# Patient Record
Sex: Female | Born: 1970 | Hispanic: Yes | Marital: Single | Smoking: Never smoker
Health system: Southern US, Community
[De-identification: ages and names within clinical notes are randomized; demographics above are authoritative.]

## PROBLEM LIST (undated history)

## (undated) DIAGNOSIS — J45909 Unspecified asthma, uncomplicated: Secondary | ICD-10-CM

---

## 2016-09-11 ENCOUNTER — Emergency Department (HOSPITAL_COMMUNITY): Payer: PRIVATE HEALTH INSURANCE

## 2016-09-11 ENCOUNTER — Emergency Department (HOSPITAL_COMMUNITY)
Admission: EM | Admit: 2016-09-11 | Discharge: 2016-09-12 | Disposition: A | Discharge status: Home / Self Care | Payer: PRIVATE HEALTH INSURANCE | Attending: Emergency Medicine | Admitting: Emergency Medicine

## 2016-09-11 ENCOUNTER — Encounter (HOSPITAL_COMMUNITY): Payer: Self-pay

## 2016-09-11 DIAGNOSIS — N939 Abnormal uterine and vaginal bleeding, unspecified: Secondary | ICD-10-CM

## 2016-09-11 LAB — URINALYSIS, ROUTINE W REFLEX MICROSCOPIC
Bilirubin Urine: NEGATIVE
Glucose, UA: NEGATIVE mg/dL
Ketones, ur: NEGATIVE mg/dL
NITRITE: NEGATIVE
PROTEIN: 30 mg/dL — AB
Specific Gravity, Urine: 1.01 (ref 1.005–1.030)
pH: 8 (ref 5.0–8.0)

## 2016-09-11 LAB — WET PREP, GENITAL
CLUE CELLS WET PREP: NONE SEEN
Sperm: NONE SEEN
TRICH WET PREP: NONE SEEN
YEAST WET PREP: NONE SEEN

## 2016-09-11 LAB — CBC
HCT: 32.7 % — ABNORMAL LOW (ref 36.0–46.0)
Hemoglobin: 10.8 g/dL — ABNORMAL LOW (ref 12.0–15.0)
MCH: 28.8 pg (ref 26.0–34.0)
MCHC: 33 g/dL (ref 30.0–36.0)
MCV: 87.2 fL (ref 78.0–100.0)
Platelets: 320 10*3/uL (ref 150–400)
RBC: 3.75 MIL/uL — AB (ref 3.87–5.11)
RDW: 13.6 % (ref 11.5–15.5)
WBC: 5.9 10*3/uL (ref 4.0–10.5)

## 2016-09-11 LAB — URINALYSIS, MICROSCOPIC (REFLEX)

## 2016-09-11 MED ORDER — NAPROXEN 375 MG PO TABS: 375.0000 mg | ORAL_TABLET | Freq: Two times a day (BID) | ORAL | 0 refills | Status: DC

## 2016-09-11 MED ORDER — MEGESTROL ACETATE 40 MG PO TABS: ORAL_TABLET | ORAL | 0 refills | Status: DC

## 2016-09-11 MED ORDER — SODIUM CHLORIDE 0.9 % IV BOLUS (SEPSIS)
1000.0000 mL | Freq: Once | INTRAVENOUS | Status: AC
  Administered 2016-09-11: 1000 mL via INTRAVENOUS

## 2016-09-11 NOTE — ED Notes (Signed)
Patient transported to Ultrasound 

## 2016-09-11 NOTE — Discharge Instructions (Signed)
Get help right away if:  You pass out.  You have to change pads every 15 to 30 minutes.  You have belly pain.  You have a fever.  You become sweaty or weak.  You are passing large blood clots from the vagina.  You feel sick to your stomach (nauseous) and throw up (vomit).

## 2016-09-11 NOTE — ED Provider Notes (Signed)
  [facility]-EMERGENCY DEPT Provider Note   CSN: [id number] Arrival date & time: 09/11/16  1447     History   Chief Complaint Chief Complaint  Patient presents with  . Vaginal Bleeding    HPI Cathy Rice is a 46 y.o. female who presents to the ED with cc of vaginal bleeding. She states that she has had cramping pelvic pain and heavy vaginal bleeding for 1 month. Sh  HPI  History reviewed. No pertinent past medical history.  There are no active problems to display for this patient.   History reviewed. No pertinent surgical history.  OB History    No data available       Home Medications    Prior to Admission medications   Not on File    Family History History reviewed. No pertinent family history.  Social History Social History  Substance Use Topics  . Smoking status: Never Smoker  . Smokeless tobacco: Never Used  . Alcohol use No     Allergies   Patient has no known allergies.   Review of Systems Review of Systems   Physical Exam Updated Vital Signs BP 109/58 (BP Location: Right Arm)   Pulse 67   Temp 97.8 F (36.6 C) (Oral)   Resp 20   SpO2 100%   Physical Exam   ED Treatments / Results  Labs (all labs ordered are listed, but only abnormal results are displayed) Labs Reviewed  CBC - Abnormal; Notable for the following:       Result Value   RBC 3.75 (*)    Hemoglobin 10.8 (*)    HCT 32.7 (*)    All other components within normal limits  WET PREP, GENITAL  RPR  HIV ANTIBODY (ROUTINE TESTING)  URINALYSIS, ROUTINE W REFLEX MICROSCOPIC  GC/CHLAMYDIA PROBE AMP ([place]) NOT AT [facility]    EKG  EKG Interpretation None       Radiology No results found.  Procedures Procedures (including critical care time)  Medications Ordered in ED Medications - No data to display   Initial Impression / Assessment and Plan / ED Course  I have reviewed the triage vital signs and the nursing notes.  Pertinent labs & imaging  results that were available during my care of the patient were reviewed by me and considered in my medical decision making (see chart for details).  Clinical Course      patient with persistent vaginal bleeding.lightheaded with standing, but improved with fluids. HGb 10.8. Orthostatics negative. I have spoken with Dr. Tinnie Gensanya Pratt of faculty practice who recommends megace 40mg  bid until bleeding resolves and then 40 mg daily. She is to follow up with [facility]. Patient appears safe for dischage. Discussed return precautions.  Final Clinical Impressions(s) / ED Diagnoses   Final diagnoses:  Vaginal bleeding    New Prescriptions New Prescriptions   No medications on file     Arthor Captainbigail Landis Cassaro, PA-C 09/11/16 2357    Margarita Grizzleanielle Ray, MD 09/12/16 2342

## 2016-09-11 NOTE — ED Notes (Signed)
Pt informed of need of urine sample 

## 2016-09-11 NOTE — ED Notes (Signed)
EDP at bedside  

## 2016-09-11 NOTE — ED Triage Notes (Signed)
Pt here with family reporting she has had menstrual period X1 month. Pt family also reports pt has been having low blood pressure. Pt alert and oriented, BP 113/43 in triage. Pt reports heavy bleeding but denies any blood clots at this time.

## 2016-09-11 NOTE — ED Notes (Signed)
Pt returned from Ultrasound.

## 2016-09-12 LAB — RPR: RPR Ser Ql: NONREACTIVE

## 2016-09-12 LAB — HIV ANTIBODY (ROUTINE TESTING W REFLEX): HIV Screen 4th Generation wRfx: NONREACTIVE

## 2016-10-27 ENCOUNTER — Encounter: Payer: PRIVATE HEALTH INSURANCE | Admitting: Obstetrics & Gynecology

## 2018-11-12 ENCOUNTER — Encounter (HOSPITAL_COMMUNITY): Payer: Self-pay | Admitting: Emergency Medicine

## 2018-11-12 ENCOUNTER — Observation Stay (HOSPITAL_COMMUNITY)
Admission: EM | Admit: 2018-11-12 | Discharge: 2018-11-12 | Disposition: A | Discharge status: Home / Self Care | Payer: Self-pay | Attending: Internal Medicine | Admitting: Internal Medicine

## 2018-11-12 ENCOUNTER — Other Ambulatory Visit: Payer: Self-pay

## 2018-11-12 ENCOUNTER — Emergency Department (HOSPITAL_COMMUNITY): Payer: Self-pay

## 2018-11-12 ENCOUNTER — Observation Stay (HOSPITAL_BASED_OUTPATIENT_CLINIC_OR_DEPARTMENT_OTHER): Payer: PRIVATE HEALTH INSURANCE

## 2018-11-12 DIAGNOSIS — Z8249 Family history of ischemic heart disease and other diseases of the circulatory system: Secondary | ICD-10-CM | POA: Insufficient documentation

## 2018-11-12 DIAGNOSIS — J45909 Unspecified asthma, uncomplicated: Secondary | ICD-10-CM | POA: Insufficient documentation

## 2018-11-12 DIAGNOSIS — I9589 Other hypotension: Secondary | ICD-10-CM

## 2018-11-12 DIAGNOSIS — E876 Hypokalemia: Secondary | ICD-10-CM | POA: Insufficient documentation

## 2018-11-12 DIAGNOSIS — E861 Hypovolemia: Secondary | ICD-10-CM

## 2018-11-12 DIAGNOSIS — R079 Chest pain, unspecified: Secondary | ICD-10-CM | POA: Diagnosis present

## 2018-11-12 DIAGNOSIS — R002 Palpitations: Secondary | ICD-10-CM | POA: Insufficient documentation

## 2018-11-12 DIAGNOSIS — R0789 Other chest pain: Principal | ICD-10-CM | POA: Insufficient documentation

## 2018-11-12 DIAGNOSIS — I959 Hypotension, unspecified: Secondary | ICD-10-CM | POA: Insufficient documentation

## 2018-11-12 DIAGNOSIS — I361 Nonrheumatic tricuspid (valve) insufficiency: Secondary | ICD-10-CM

## 2018-11-12 DIAGNOSIS — Z79899 Other long term (current) drug therapy: Secondary | ICD-10-CM | POA: Insufficient documentation

## 2018-11-12 HISTORY — DX: Unspecified asthma, uncomplicated: J45.909

## 2018-11-12 LAB — COMPREHENSIVE METABOLIC PANEL
ALT: 28 U/L (ref 0–44)
AST: 40 U/L (ref 15–41)
Albumin: 3.8 g/dL (ref 3.5–5.0)
Alkaline Phosphatase: 65 U/L (ref 38–126)
Anion gap: 12 (ref 5–15)
BUN: 8 mg/dL (ref 6–20)
CHLORIDE: 105 mmol/L (ref 98–111)
CO2: 20 mmol/L — ABNORMAL LOW (ref 22–32)
CREATININE: 0.79 mg/dL (ref 0.44–1.00)
Calcium: 9.4 mg/dL (ref 8.9–10.3)
GFR calc Af Amer: >60 mL/min (ref >60)
GFR calc non Af Amer: >60 mL/min (ref >60)
Glucose, Bld: 123 mg/dL — ABNORMAL HIGH (ref 70–99)
Potassium: 2.9 mmol/L — ABNORMAL LOW (ref 3.5–5.1)
Sodium: 137 mmol/L (ref 135–145)
Total Bilirubin: 0.4 mg/dL (ref 0.3–1.2)
Total Protein: 7.2 g/dL (ref 6.5–8.1)

## 2018-11-12 LAB — CBC WITH DIFFERENTIAL/PLATELET
Abs Immature Granulocytes: 0.02 10*3/uL (ref 0.00–0.07)
Basophils Absolute: 0 10*3/uL (ref 0.0–0.1)
Basophils Relative: 1 %
Eosinophils Absolute: 0.1 10*3/uL (ref 0.0–0.5)
Eosinophils Relative: 2 %
HCT: 37.9 % (ref 36.0–46.0)
Hemoglobin: 12.2 g/dL (ref 12.0–15.0)
Immature Granulocytes: 0 %
Lymphocytes Relative: 40 %
Lymphs Abs: 3.2 10*3/uL (ref 0.7–4.0)
MCH: 28 pg (ref 26.0–34.0)
MCHC: 32.2 g/dL (ref 30.0–36.0)
MCV: 86.9 fL (ref 80.0–100.0)
Monocytes Absolute: 0.6 10*3/uL (ref 0.1–1.0)
Monocytes Relative: 7 %
Neutro Abs: 4.1 10*3/uL (ref 1.7–7.7)
Neutrophils Relative %: 50 %
PLATELETS: 340 10*3/uL (ref 150–400)
RBC: 4.36 MIL/uL (ref 3.87–5.11)
RDW: 13.1 % (ref 11.5–15.5)
WBC: 8.1 10*3/uL (ref 4.0–10.5)
nRBC: 0 % (ref 0.0–0.2)

## 2018-11-12 LAB — MRSA PCR SCREENING: MRSA by PCR: NEGATIVE

## 2018-11-12 LAB — HEMOGLOBIN A1C
Hgb A1c MFr Bld: 5.4 % (ref 4.8–5.6)
Mean Plasma Glucose: 108.28 mg/dL

## 2018-11-12 LAB — LIPASE, BLOOD: LIPASE: 34 U/L (ref 11–51)

## 2018-11-12 LAB — LIPID PANEL
Cholesterol: 167 mg/dL (ref 0–200)
HDL: 49 mg/dL (ref >40)
LDL Cholesterol: 113 mg/dL — ABNORMAL HIGH (ref 0–99)
Total CHOL/HDL Ratio: 3.4 RATIO
Triglycerides: 25 mg/dL (ref <150)
VLDL: 5 mg/dL (ref 0–40)

## 2018-11-12 LAB — TROPONIN I
Troponin I: <0.03 ng/mL (ref <0.03)
Troponin I: <0.03 ng/mL (ref <0.03)
Troponin I: <0.03 ng/mL (ref <0.03)

## 2018-11-12 LAB — I-STAT TROPONIN, ED: TROPONIN I, POC: 0 ng/mL (ref 0.00–0.08)

## 2018-11-12 LAB — I-STAT BETA HCG BLOOD, ED (MC, WL, AP ONLY)

## 2018-11-12 LAB — BASIC METABOLIC PANEL
ANION GAP: 10 (ref 5–15)
BUN: 6 mg/dL (ref 6–20)
CHLORIDE: 111 mmol/L (ref 98–111)
CO2: 18 mmol/L — ABNORMAL LOW (ref 22–32)
Calcium: 8.1 mg/dL — ABNORMAL LOW (ref 8.9–10.3)
Creatinine, Ser: 0.66 mg/dL (ref 0.44–1.00)
GFR calc Af Amer: >60 mL/min (ref >60)
GFR calc non Af Amer: >60 mL/min (ref >60)
Glucose, Bld: 96 mg/dL (ref 70–99)
Potassium: 4.4 mmol/L (ref 3.5–5.1)
Sodium: 139 mmol/L (ref 135–145)

## 2018-11-12 LAB — ECHOCARDIOGRAM COMPLETE
Height: 64 in
Weight: 2472.68 oz

## 2018-11-12 LAB — HIV ANTIBODY (ROUTINE TESTING W REFLEX): HIV Screen 4th Generation wRfx: NONREACTIVE

## 2018-11-12 MED ORDER — SODIUM CHLORIDE 0.9 % IV BOLUS
1000.0000 mL | Freq: Once | INTRAVENOUS | Status: AC
  Administered 2018-11-12: 1000 mL via INTRAVENOUS

## 2018-11-12 MED ORDER — ASPIRIN EC 81 MG PO TBEC: 81.0000 mg | DELAYED_RELEASE_TABLET | Freq: Every day | ORAL | Status: DC

## 2018-11-12 MED ORDER — POTASSIUM CHLORIDE CRYS ER 20 MEQ PO TBCR
40.0000 meq | EXTENDED_RELEASE_TABLET | Freq: Once | ORAL | Status: AC
  Administered 2018-11-12: 40 meq via ORAL
  Filled 2018-11-12: qty 2

## 2018-11-12 MED ORDER — ENOXAPARIN SODIUM 40 MG/0.4ML ~~LOC~~ SOLN
40.0000 mg | Freq: Every day | SUBCUTANEOUS | Status: DC
  Administered 2018-11-12: 40 mg via SUBCUTANEOUS
  Filled 2018-11-12 (×2): qty 0.4

## 2018-11-12 MED ORDER — ASPIRIN 81 MG PO CHEW: 324.0000 mg | CHEWABLE_TABLET | Freq: Once | ORAL | Status: DC

## 2018-11-12 MED ORDER — ACETAMINOPHEN 325 MG PO TABS: 650.0000 mg | ORAL_TABLET | ORAL | Status: DC | PRN

## 2018-11-12 MED ORDER — NITROGLYCERIN 0.4 MG SL SUBL
0.4000 mg | SUBLINGUAL_TABLET | SUBLINGUAL | Status: DC | PRN
  Filled 2018-11-12: qty 1

## 2018-11-12 MED ORDER — FENTANYL CITRATE (PF) 100 MCG/2ML IJ SOLN
50.0000 ug | Freq: Once | INTRAMUSCULAR | Status: AC
  Administered 2018-11-12: 50 ug via INTRAVENOUS

## 2018-11-12 MED ORDER — NITROGLYCERIN 0.4 MG SL SUBL: 0.4000 mg | SUBLINGUAL_TABLET | SUBLINGUAL | Status: DC | PRN

## 2018-11-12 MED ORDER — SODIUM CHLORIDE 0.9 % IV SOLN
INTRAVENOUS | Status: DC
  Administered 2018-11-12: 08:00:00 via INTRAVENOUS

## 2018-11-12 MED ORDER — MAGNESIUM SULFATE 2 GM/50ML IV SOLN
2.0000 g | Freq: Once | INTRAVENOUS | Status: AC
  Administered 2018-11-12: 2 g via INTRAVENOUS
  Filled 2018-11-12: qty 50

## 2018-11-12 MED ORDER — MORPHINE SULFATE (PF) 2 MG/ML IV SOLN: 1.0000 mg | INTRAVENOUS | Status: DC | PRN

## 2018-11-12 MED ORDER — SODIUM CHLORIDE 0.9 % IV SOLN
INTRAVENOUS | Status: DC
  Administered 2018-11-12: 10:00:00 via INTRAVENOUS

## 2018-11-12 MED ORDER — POTASSIUM CHLORIDE 10 MEQ/100ML IV SOLN
10.0000 meq | Freq: Once | INTRAVENOUS | Status: AC
  Administered 2018-11-12: 10 meq via INTRAVENOUS
  Filled 2018-11-12: qty 100

## 2018-11-12 MED ORDER — ONDANSETRON HCL 4 MG/2ML IJ SOLN: 4.0000 mg | Freq: Four times a day (QID) | INTRAMUSCULAR | Status: DC | PRN

## 2018-11-12 MED ORDER — SODIUM CHLORIDE 0.9% FLUSH
3.0000 mL | Freq: Once | INTRAVENOUS | Status: AC
  Administered 2018-11-12: 3 mL via INTRAVENOUS

## 2018-11-12 NOTE — Progress Notes (Signed)
  Echocardiogram 2D Echocardiogram has been performed.  Leta Jungling M 11/12/2018, 10:23 AM

## 2018-11-12 NOTE — Consult Note (Signed)
Cardiology Consultation:   Patient ID: Cathy Rice MRN: [id number]; DOB: Dec 18, 1970  Admit date: 11/12/2018 Date of Consult: 11/12/2018  Primary Care Provider: Patient, No Pcp Per Primary Cardiologist: None  Patient Profile:   Cathy Rice de Ezequiel Ganser is a 48 y.o. female with a hx of asthma who is being seen today for the evaluation of chest pain at the request of Dr. Clayborne Dana.  History of Present Illness:   Cathy Rice is a 48 y.o. female with a hx of asthma who is being seen today for the evaluation of chest pain.  The patient primarily speaks Portugese, and history is gathered with the assistance of her sister-in-law at bedside. She has no cardiac history or history of chest pain or other cardiac symptoms. She was in her normal state of health until this evening, when she developed chest discomfort when laying in bed. She described the pain to be both a sharp and pressure sensation. She had some dyspnea without any other associated symptoms.   She presented to the ED where SBP were 80-100 with HR 70s. ECG showed NSR with mild depressions in lateral precordial leads. Troponin negative x1. Labs were otherwise notable for K 2.9. She was admitted to the hospitalist service, and cardiology was consulted for further recommendations.   Past Medical History:  Diagnosis Date  . Asthma     History reviewed. No pertinent surgical history.   Home Medications:  Prior to Admission medications   Medication Sig Start Date End Date Taking? Authorizing Provider  Ascorbic Acid (VITA-C PO) Take 1 tablet by mouth daily.   Yes [provider]  Cyanocobalamin (VITAMIN B-12 PO) Take 1 tablet by mouth daily.   Yes [provider]  Methylsulfonylmethane (MSM PO) Take 1 tablet by mouth daily.   Yes [provider]    Inpatient Medications: Scheduled Meds: . aspirin  324 mg Oral Once  . [START ON 11/13/2018] aspirin EC  81 mg Oral Daily  . enoxaparin  (LOVENOX) injection  40 mg Subcutaneous Daily  . fentaNYL (SUBLIMAZE) injection  50 mcg Intravenous Once  . sodium chloride flush  3 mL Intravenous Once   Continuous Infusions: . sodium chloride     PRN Meds: acetaminophen, morphine injection, nitroGLYCERIN, ondansetron (ZOFRAN) IV  Allergies:    Allergies  Allergen Reactions  . Lactose Intolerance (Gi) Diarrhea  . Wheat Bran Diarrhea    Social History:   Social History   Socioeconomic History  . Marital status: Single    Spouse name: Not on file  . Number of children: Not on file  . Years of education: Not on file  . Highest education level: Not on file  Occupational History  . Not on file  Social Needs  . Financial resource strain: Not on file  . Food insecurity:    Worry: Not on file    Inability: Not on file  . Transportation needs:    Medical: Not on file    Non-medical: Not on file  Tobacco Use  . Smoking status: Never Smoker  . Smokeless tobacco: Never Used  Substance and Sexual Activity  . Alcohol use: No  . Drug use: Never  . Sexual activity: Not on file  Lifestyle  . Physical activity:    Days per week: Not on file    Minutes per session: Not on file  . Stress: Not on file  Relationships  . Social connections:    Talks on phone: Not on file  Gets together: Not on file    Attends religious service: Not on file    Active member of club or organization: Not on file    Attends meetings of clubs or organizations: Not on file    Relationship status: Not on file  . Intimate partner violence:    Fear of current or ex partner: Not on file    Emotionally abused: Not on file    Physically abused: Not on file    Forced sexual activity: Not on file  Other Topics Concern  . Not on file  Social History Narrative  . Not on file    Family History:     Family History  Problem Relation Age of Onset  . Heart attack Father   . Heart disease Sister      ROS:  Please see the history of present illness.     All other ROS reviewed and negative.     Physical Exam/Data:   Vitals:   11/12/18 0115 11/12/18 0118 11/12/18 0300 11/12/18 0400  BP: (!) 103/59 (!) 103/59 (!) 89/29 (!) 91/46  Pulse: 93 82 71 74  Resp: 20 (!) 22 18 12   Temp:  98 F (36.7 C)    TempSrc:  Oral    SpO2: 100% 100% 98% 100%  Weight:  68 kg    Height:  5\' 4"  (1.626 m)     No intake or output data in the 24 hours ending 11/12/18 0444 Last 3 Weights 11/12/2018  Weight (lbs) 149 lb 14.6 oz  Weight (kg) 68 kg     Body mass index is 25.73 kg/m.  General:  Well nourished, well developed, in no acute distress  HEENT: normal Lymph: no adenopathy  Cardiac:  normal S1, S2; RRR; no murmur   Lungs:  clear to auscultation bilaterally, no wheezing, rhonchi or rales  Abd: soft, nontender, no hepatomegaly  Ext: no edema Musculoskeletal:  No deformities, BUE and BLE strength normal and equal Skin: warm and dry  Neuro:  No focal abnormalities noted Psych:  Normal affect   EKG:  The EKG was personally reviewed and demonstrates:  NSR with ST depressions in lateral precordial leads Telemetry:  Telemetry was personally reviewed and demonstrates:  No events  Relevant CV Studies: None  Laboratory Data:  Chemistry Recent Labs  Lab 11/12/18 0115  NA 137  K 2.9*  CL 105  CO2 20*  GLUCOSE 123*  BUN 8  CREATININE 0.79  CALCIUM 9.4  GFRNONAA >60  GFRAA >60  ANIONGAP 12    Recent Labs  Lab 11/12/18 0115  PROT 7.2  ALBUMIN 3.8  AST 40  ALT 28  ALKPHOS 65  BILITOT 0.4   Hematology Recent Labs  Lab 11/12/18 0115  WBC 8.1  RBC 4.36  HGB 12.2  HCT 37.9  MCV 86.9  MCH 28.0  MCHC 32.2  RDW 13.1  PLT 340   Cardiac Enzymes Recent Labs  Lab 11/12/18 0115  TROPONINI <0.03    Recent Labs  Lab 11/12/18 0148  TROPIPOC 0.00    BNPNo results for input(s): BNP, PROBNP in the last 168 hours.  DDimer No results for input(s): DDIMER in the last 168 hours.  Radiology/Studies:  Dg Chest 2 View  Result  Date: 11/12/2018 CLINICAL DATA:  Chest pain EXAM: CHEST - 2 VIEW COMPARISON:  None. FINDINGS: The heart size and mediastinal contours are within normal limits. Both lungs are clear. The visualized skeletal structures are unremarkable. IMPRESSION: No active cardiopulmonary disease. Electronically Signed  By: Jasmine Pang M.D.   On: 11/12/2018 01:51    Assessment and Plan:   Chest pain The patient has no significant medical history although she has a family history of heart disease. She presents with new onset, intermittent chest pain. Her pain has typical and atypical features for angina. ECG has nonspecific changes and troponin is negative. Her presentation is not typical for ACS.  -Continue to trend troponin -Echocardiogram ordered -Lipid panel, HgA1c ordered -May benefit from stress testing.   Hypokalemia Unclear why K is so low.  -Continue to replete.     For questions or updates, please contact [facility] Please consult [email or web address] for contact info under     Signed, Ernest Mallick, MD  11/12/2018 4:44 AM

## 2018-11-12 NOTE — ED Notes (Signed)
Pt appears very sleepy, wakes up appropriately, NAD, pt c/o pain when HOB raised.

## 2018-11-12 NOTE — ED Notes (Signed)
Hospitalist and cardiology at bedside

## 2018-11-12 NOTE — Progress Notes (Addendum)
Patient is alert and oriented  X 4. Out of bed ambulating with no complaints of shortness of breath, dizziness, and/or headache.

## 2018-11-12 NOTE — Discharge Summary (Signed)
Physician Discharge Summary  Cathy Rice WNU:[id number] DOB: 1971-06-02 DOA: 11/12/2018  PCP: Patient, No Pcp Per  Admit date: 11/12/2018 Discharge date: 11/12/2018  Admitted From: Home Disposition: Home  Recommendations for Outpatient Follow-up:  1. Follow up with PCP  Home Health: Home Equipment/Devices: None  Discharge Condition: Stable CODE STATUS: Full code Diet recommendation: Regular diet  Brief/Interim Summary: 48 year old female with history of asthma, primarily [place] speaking, admitted with chest pain during the night.  In the ER EKG with ST depression, chest x-ray unremarkable, blood pressure soft in the 80s to 100, hypokalemia negative first troponin and was admitted for chest pain evaluation Seen by cardiology and plan to obtain echocardiogram, rule out with serial troponin.  Noted possible stress test plan. This morning patient denies any chest pain, resting comfortably.  Blood pressure soft, asymptomatic.  Patient and family her blood pressure runs low and is normal for her, she is asymptomatic, able to ambulate in the room without issues. Seen by cardiologist, serial troponins has been negative ACS rule out.  Neurology does not advise any further inpatient work-up and okay for discharge home.  Echocardiogram is done was grossly normal per cardiology report.  Final report came back later as below.  Discharge Diagnoses:  Principal Problem:   Chest pain at rest Active Problems:   Hypokalemia   Hypotension  Atypical chest pain,  Reproducible w palpation. ACS ruled out.  Follow-up with PCP outpatient.  No further cardiac work-up as per cardiology. Hypokalemia-resolved Hypotension, chronic and asymptomatic.  Discharge Instructions  Discharge Instructions    Diet - low sodium heart healthy   Complete by:  As directed    Discharge instructions   Complete by:  As directed    Please call call MD or return to ER for similar recurring problem,  nausea/vomiting, uncontrolled pain, chest pain.  Please follow-up your doctor as instructed in a week time and call the office for appointment. Please avoid alcohol, smoking, or any other illicit substance.   Increase activity slowly   Complete by:  As directed      Allergies as of 11/12/2018      Reactions   Lactose Intolerance (gi) Diarrhea   Wheat Bran Diarrhea      Medication List    TAKE these medications   MSM PO Take 1 tablet by mouth daily.   VITA-C PO Take 1 tablet by mouth daily.   VITAMIN B-12 PO Take 1 tablet by mouth daily.      Follow-up Information    primary care physician Follow up in 1 week(s).          Allergies  Allergen Reactions  . Lactose Intolerance (Gi) Diarrhea  . Wheat Bran Diarrhea    Consultations:  Cardiology   Procedures/Studies: Dg Chest 2 View  Result Date: 11/12/2018 CLINICAL DATA:  Chest pain EXAM: CHEST - 2 VIEW COMPARISON:  None. FINDINGS: The heart size and mediastinal contours are within normal limits. Both lungs are clear. The visualized skeletal structures are unremarkable. IMPRESSION: No active cardiopulmonary disease. Electronically Signed   By: Jasmine Pang M.D.   On: 11/12/2018 01:51   Echo: result  1. The left ventricle has normal systolic function with an ejection fraction of 60-65%. The cavity size was normal. Left ventricular diastolic parameters were normal. No evidence of left ventricular regional wall motion abnormalities.  2. Mild septal flattening in diastole suggests possible RV volume overload.  3. The right ventricle has normal systolic function. The cavity was RV is  mild-moderately enlarged. There is no increase in right ventricular wall thickness.  4. No hemodynamically significant valve disease.  5. The inferior vena cava was dilated in size with >50% respiratory variability. Subjective: Resting comfortably.  No more chest pain.  Family at the bedside.  Discharge Exam: Vitals:   11/12/18 0802  11/12/18 1120  BP: (!) 91/49 (!) 94/50  Pulse: 72 69  Resp: 13 11  Temp: 98 F (36.7 C) (!) 97.4 F (36.3 C)  SpO2: 99% 96%   Vitals:   11/12/18 0400 11/12/18 0500 11/12/18 0802 11/12/18 1120  BP: (!) 91/46 (!) 97/51 (!) 91/49 (!) 94/50  Pulse: 74 75 72 69  Resp: Temp:  98.3 F (36.8 C) 98 F (36.7 C) (!) 97.4 F (36.3 C)  TempSrc:  Oral Oral Oral  SpO2: 100% 100% 99% 96%  Weight:  70.1 kg    Height:   (1.626 m)      General: Pt is alert, awake, not in acute distress Cardiovascular: RRR, S1/S2 +, no rubs, no gallops Respiratory: CTA bilaterally, no wheezing, no rhonchi Abdominal: Soft, NT, ND, bowel sounds + Extremities: no edema, no cyanosis   The results of significant diagnostics from this hospitalization (including imaging, microbiology, ancillary and laboratory) are listed below for reference.     Microbiology: Recent Results (from the past 240 hour(s))  MRSA PCR Screening     Status: None   Collection Time: 11/12/18  5:37 AM  Result Value Ref Range Status   MRSA by PCR NEGATIVE NEGATIVE Final    Comment:        The GeneXpert MRSA Assay (FDA approved for NASAL specimens only), is one component of a comprehensive MRSA colonization surveillance program. It is not intended to diagnose MRSA infection nor to guide or monitor treatment for MRSA infections. Performed at [facility], [address], [place], [place] [address]      Labs: BNP (last 3 results) No results for input(s): BNP in the last 8760 hours. Basic Metabolic Panel: Recent Labs  Lab 11/12/18 0115 11/12/18 0655  NA 137 139  K 2.9* 4.4  CL 105 111  CO2 20* 18*  GLUCOSE 123* 96  BUN 8 6  CREATININE 0.79 0.66  CALCIUM 9.4 8.1*   Liver Function Tests: Recent Labs  Lab 11/12/18 0115  AST 40  ALT 28  ALKPHOS 65  BILITOT 0.4  PROT 7.2  ALBUMIN 3.8   Recent Labs  Lab 11/12/18 0115  LIPASE 34   No results for input(s): AMMONIA in the last 168  hours. CBC: Recent Labs  Lab 11/12/18 0115  WBC 8.1  NEUTROABS 4.1  HGB 12.2  HCT 37.9  MCV 86.9  PLT 340   Cardiac Enzymes: Recent Labs  Lab 11/12/18 0115 11/12/18 0655 11/12/18 1302  TROPONINI <0.03 <0.03 <0.03   BNP: Invalid input(s): POCBNP CBG: No results for input(s): GLUCAP in the last 168 hours. D-Dimer No results for input(s): DDIMER in the last 72 hours. Hgb A1c Recent Labs    11/12/18 0655  HGBA1C 5.4   Lipid Profile Recent Labs    11/12/18 0655  CHOL 167  HDL 49  LDLCALC 113*  TRIG 25  CHOLHDL 3.4   Thyroid function studies No results for input(s): TSH, T4TOTAL, T3FREE, THYROIDAB in the last 72 hours.  Invalid input(s): FREET3 Anemia work up No results for input(s): VITAMINB12, FOLATE, FERRITIN, TIBC, IRON, RETICCTPCT in the last 72 hours. Urinalysis    Component  Value Date/Time   COLORURINE RED (A) 09/11/2016 2202   APPEARANCEUR CLOUDY (A) 09/11/2016 2202   LABSPEC 1.010 09/11/2016 2202   PHURINE 8.0 09/11/2016 2202   GLUCOSEU NEGATIVE 09/11/2016 2202   HGBUR LARGE (A) 09/11/2016 2202   BILIRUBINUR NEGATIVE 09/11/2016 2202   KETONESUR NEGATIVE 09/11/2016 2202   PROTEINUR 30 (A) 09/11/2016 2202   NITRITE NEGATIVE 09/11/2016 2202   LEUKOCYTESUR TRACE (A) 09/11/2016 2202   Sepsis Labs Invalid input(s): PROCALCITONIN,  WBC,  LACTICIDVEN Microbiology Recent Results (from the past 240 hour(s))  MRSA PCR Screening     Status: None   Collection Time: 11/12/18  5:37 AM  Result Value Ref Range Status   MRSA by PCR NEGATIVE NEGATIVE Final    Comment:        The GeneXpert MRSA Assay (FDA approved for NASAL specimens only), is one component of a comprehensive MRSA colonization surveillance program. It is not intended to diagnose MRSA infection nor to guide or monitor treatment for MRSA infections. Performed at [facility], [address] [place], [place] [address]      Time coordinating discharge: 25  minutes  SIGNED:   Lanae Boast, MD  [facility] 11/12/2018, 2:25 PM  If 7PM-7AM, please contact night-coverage [email or web address]

## 2018-11-12 NOTE — Progress Notes (Signed)
Discharged: Patient left unit ambulating accompanied by family members.

## 2018-11-12 NOTE — ED Provider Notes (Signed)
Emergency Department Provider Note   I have reviewed the triage vital signs and the nursing notes.   HISTORY  Chief Complaint Chest Pain   HPI Cathy Rice is a 48 y.o. female without significant past medical history the presents emergency department today with an hour half of waxing and waning left-sided chest pressure associated with nausea and dyspnea and lightheadedness.  Her sister-in-law is with her states that she also was clammy during his episodes.  Not related to eating.  Not related exertion.  No traumas.  No other associated symptoms. No other associated or modifying symptoms.    Past Medical History:  Diagnosis Date  . Asthma     Patient Active Problem List   Diagnosis Date Noted  . Chest pain at rest 11/12/2018  . Hypokalemia 11/12/2018  . Hypotension 11/12/2018    History reviewed. No pertinent surgical history.    Allergies Lactose intolerance (gi) and Wheat bran  Family History  Problem Relation Age of Onset  . Heart attack Father   . Heart disease Sister     Social History Social History   Tobacco Use  . Smoking status: Never Smoker  . Smokeless tobacco: Never Used  Substance Use Topics  . Alcohol use: No  . Drug use: Never    Review of Systems  All other systems negative except as documented in the HPI. All pertinent positives and negatives as reviewed in the HPI. ____________________________________________   PHYSICAL EXAM:  VITAL SIGNS: ED Triage Vitals [11/12/18 0118]  Enc Vitals Group     BP (!) 103/59     Pulse Rate 82     Resp (!) 22     Temp 98 F (36.7 C)     Temp Source Oral     SpO2 100 %     Weight 149 lb 14.6 oz (68 kg)     Height 5\' 4"  (1.626 m)    Constitutional: Alert and oriented. Well appearing and in no acute distress. Eyes: Conjunctivae are normal. PERRL. EOMI. Head: Atraumatic. Nose: No congestion/rhinnorhea. Mouth/Throat: Mucous membranes are moist.  Oropharynx non-erythematous.  Neck: No stridor.  No meningeal signs.   Cardiovascular: Normal rate, regular rhythm. Good peripheral circulation. Grossly normal heart sounds.   Respiratory: Normal respiratory effort.  No retractions. Lungs CTAB. Gastrointestinal: Soft and nontender. No distention.  Musculoskeletal: No lower extremity tenderness nor edema. No gross deformities of extremities. Neurologic:  Normal speech and language. No gross focal neurologic deficits are appreciated.  Skin:  Skin is warm, dry and intact. No rash noted.   ____________________________________________   LABS (all labs ordered are listed, but only abnormal results are displayed)  Labs Reviewed  COMPREHENSIVE METABOLIC PANEL - Abnormal; Notable for the following components:      Result Value   Potassium 2.9 (*)    CO2 20 (*)    Glucose, Bld 123 (*)    All other components within normal limits  BASIC METABOLIC PANEL - Abnormal; Notable for the following components:   CO2 18 (*)    Calcium 8.1 (*)    All other components within normal limits  LIPID PANEL - Abnormal; Notable for the following components:   LDL Cholesterol 113 (*)    All other components within normal limits  MRSA PCR SCREENING  CBC WITH DIFFERENTIAL/PLATELET  TROPONIN I  LIPASE, BLOOD  TROPONIN I  HEMOGLOBIN A1C  HIV ANTIBODY (ROUTINE TESTING W REFLEX)  TROPONIN I  TROPONIN I  I-STAT TROPONIN, ED  I-STAT BETA  HCG BLOOD, ED ([facility], [facility], [facility] ONLY)  POC URINE PREG, ED   ____________________________________________  EKG   EKG Interpretation  Date/Time:  Sunday November 12 2018 01:15:11 EDT Ventricular Rate:  89 PR Interval:    QRS Duration: 93 QT Interval:  379 QTC Calculation: 462 R Axis:   67 Text Interpretation:  Sinus rhythm Borderline ST depression, diffuse leads st depressions in V3-4 Confirmed by Marily Memos [phone]) on 11/12/2018 2:08:46 AM       ____________________________________________  RADIOLOGY  Dg Chest 2 View  Result Date: 11/12/2018  CLINICAL DATA:  Chest pain EXAM: CHEST - 2 VIEW COMPARISON:  None. FINDINGS: The heart size and mediastinal contours are within normal limits. Both lungs are clear. The visualized skeletal structures are unremarkable. IMPRESSION: No active cardiopulmonary disease. Electronically Signed   By: Jasmine Pang M.D.   On: 11/12/2018 01:51    ____________________________________________   PROCEDURES  Procedure(s) performed:   Procedures   ____________________________________________   INITIAL IMPRESSION / ASSESSMENT AND PLAN / ED COURSE  For troponin negative, EKG shows some ST depressions in V3 V4 but posterior EKG did not show any posterior elevation.  Discussed this with cardiology who will see in consultation however recommends admission to medicine.  Discussed with medicine who will see. Pain improved.      Pertinent labs & imaging results that were available during my care of the patient were reviewed by me and considered in my medical decision making (see chart for details).  ____________________________________________  FINAL CLINICAL IMPRESSION(S) / ED DIAGNOSES  Final diagnoses:  Chest pain, unspecified type     MEDICATIONS GIVEN DURING THIS VISIT:  Medications  aspirin chewable tablet 324 mg (324 mg Oral Not Given 11/12/18 0410)  aspirin EC tablet 81 mg (has no administration in time range)  nitroGLYCERIN (NITROSTAT) SL tablet 0.4 mg (has no administration in time range)  acetaminophen (TYLENOL) tablet 650 mg (has no administration in time range)  ondansetron (ZOFRAN) injection 4 mg (has no administration in time range)  enoxaparin (LOVENOX) injection 40 mg (has no administration in time range)  morphine 2 MG/ML injection 1-3 mg (has no administration in time range)  0.9 %  sodium chloride infusion ( Intravenous New Bag/Given 11/12/18 0805)  sodium chloride flush (NS) 0.9 % injection 3 mL (3 mLs Intravenous Given by Other 11/12/18 0802)  fentaNYL (SUBLIMAZE) injection  50 mcg (50 mcg Intravenous Given by Other 11/12/18 0803)  potassium chloride SA (K-DUR,KLOR-CON) CR tablet 40 mEq (40 mEq Oral Given 11/12/18 0410)  magnesium sulfate IVPB 2 g 50 mL (0 g Intravenous Stopped 11/12/18 0428)  potassium chloride 10 mEq in 100 mL IVPB (0 mEq Intravenous Stopped 11/12/18 0426)  sodium chloride 0.9 % bolus 1,000 mL (0 mLs Intravenous Stopped 11/12/18 0409)     NEW OUTPATIENT MEDICATIONS STARTED DURING THIS VISIT:  Current Discharge Medication List      Note:  This note was prepared with assistance of Dragon voice recognition software. Occasional wrong-word or sound-a-like substitutions may have occurred due to the inherent limitations of voice recognition software.  Kimaya Whitlatch, Barbara Cower, MD 11/12/18 [phone]

## 2018-11-12 NOTE — Progress Notes (Signed)
Progress Note  Patient Name: Catelin Vogen Date of Encounter: 11/12/2018  Primary Cardiologist:   No primary care provider on file.   Subjective   Chest pain with palpitation.   Feels dizzy.    Inpatient Medications    Scheduled Meds: . aspirin  324 mg Oral Once  . [START ON 11/13/2018] aspirin EC  81 mg Oral Daily  . enoxaparin (LOVENOX) injection  40 mg Subcutaneous Daily   Continuous Infusions: . sodium chloride 125 mL/hr at 11/12/18 0952   PRN Meds: acetaminophen, morphine injection, nitroGLYCERIN, ondansetron (ZOFRAN) IV   Vital Signs    Vitals:   11/12/18 0400 11/12/18 0500 11/12/18 0802 11/12/18 1120  BP: (!) 91/46 (!) 97/51 (!) 91/49 (!) 94/50  Pulse: 74 75 72 69  Resp: 12 12 13 11   Temp:  98.3 F (36.8 C) 98 F (36.7 C) (!) 97.4 F (36.3 C)  TempSrc:  Oral Oral Oral  SpO2: 100% 100% 99% 96%  Weight:  70.1 kg    Height:  5\' 4"  (1.626 m)      Intake/Output Summary (Last 24 hours) at 11/12/2018 1143 Last data filed at 11/12/2018 0600 Gross per 24 hour  Intake 374.17 ml  Output -  Net 374.17 ml   Filed Weights   11/12/18 0118 11/12/18 0500  Weight: 68 kg 70.1 kg    Telemetry    NSR - Personally Reviewed  ECG    NA - Personally Reviewed  Physical Exam   GEN: No acute distress.  She looks fatigued with mild discomfort Neck: No  JVD Cardiac: RRR, no murmurs, rubs, or gallops.  Respiratory: Clear  to auscultation bilaterally. GI: Soft, nontender, non-distended  MS: No  edema; No deformity. Neuro:  Nonfocal  Psych: Normal affect   Labs    Chemistry Recent Labs  Lab 11/12/18 0115 11/12/18 0655  NA 137 139  K 2.9* 4.4  CL 105 111  CO2 20* 18*  GLUCOSE 123* 96  BUN 8 6  CREATININE 0.79 0.66  CALCIUM 9.4 8.1*  PROT 7.2  --   ALBUMIN 3.8  --   AST 40  --   ALT 28  --   ALKPHOS 65  --   BILITOT 0.4  --   GFRNONAA >60 >60  GFRAA >60 >60  ANIONGAP 12 10     Hematology Recent Labs  Lab 11/12/18 0115  WBC 8.1   RBC 4.36  HGB 12.2  HCT 37.9  MCV 86.9  MCH 28.0  MCHC 32.2  RDW 13.1  PLT 340    Cardiac Enzymes Recent Labs  Lab 11/12/18 0115 11/12/18 0655  TROPONINI <0.03 <0.03    Recent Labs  Lab 11/12/18 0148  TROPIPOC 0.00     BNPNo results for input(s): BNP, PROBNP in the last 168 hours.   DDimer No results for input(s): DDIMER in the last 168 hours.   Radiology    Dg Chest 2 View  Result Date: 11/12/2018 CLINICAL DATA:  Chest pain EXAM: CHEST - 2 VIEW COMPARISON:  None. FINDINGS: The heart size and mediastinal contours are within normal limits. Both lungs are clear. The visualized skeletal structures are unremarkable. IMPRESSION: No active cardiopulmonary disease. Electronically Signed   By: Jasmine Pang M.D.   On: 11/12/2018 01:51    Cardiac Studies   ECHO:  Results pending.  (See below)  Patient Profile     48 y.o. female with a hx of asthma who is being seen today for the evaluation of  chest pain at the request of Dr. Clayborne Dana.  Assessment & Plan    CHEST PAIN:   Enzymes negative.  Echo prelim reviewed by me.  Normal wall motion.  Final reading pending.   No objective evidence of ischemia.  Pain is very atypical and reproducible with palpation.  Very low pretest probability of obstructive CAD.  No further in patient testing.    HYPOKALEMIA:  Potassium supplemented.     For questions or updates, please contact [facility] Please consult [email or web address] for contact info under Cardiology/STEMI.   Signed, Rollene Rotunda, MD  11/12/2018, 11:43 AM

## 2018-11-12 NOTE — H&P (Signed)
History and Physical    Cathy Rice ZVG:[id number] DOB: 05-16-1971 DOA: 11/12/2018  PCP: Patient, No Pcp Per   Patient coming from: Home   Chief Complaint: Chest pain   HPI: Cathy Rice is a 48 y.o. female with medical history significant for asthma, now presenting to the emergency department for evaluation of chest pain.  The patient was reportedly in her usual state of health and it had an uneventful day when she laid down to sleep last night, but developed acute onset of severe pain in the center of her lower chest.  Onset was while she was lying in bed, pain was severe, nonradiating, associated with mild dyspnea and nausea, but no diaphoresis or vomiting.  She has not been coughing, has not had a fever, has not traveled recently or been immobilized, denies leg swelling or tenderness, and denies personal or family history of VTE.  She has never experienced this previously.  Initially, symptoms seem to improve when her mother was rubbing her sternum.  She is unable to identify any alleviating or exacerbating factors.  ED Course: Upon arrival to the ED, patient is found to be afebrile, saturating well on room air, and with blood pressure 103/59.  EKG features sinus rhythm with ST depression in V4.  Chest x-ray is unremarkable.  Chemistry panel is notable for potassium of 2.9.  CBC is unremarkable.  Troponin is undetectable.  Patient was given 3 and 24 mg of aspirin, 2 g IV magnesium, 40 mEq oral potassium, and 10 mEq IV potassium in the ED.  Cardiology was consulted by the ED physician and recommended medical admission.  Review of Systems:  All other systems reviewed and apart from HPI, are negative.  Past Medical History:  Diagnosis Date  . Asthma     History reviewed. No pertinent surgical history.   reports that she has never smoked. She has never used smokeless tobacco. She reports that she does not drink alcohol or use drugs.  Allergies  Allergen Reactions   . Lactose Intolerance (Gi) Diarrhea  . Wheat Bran Diarrhea    Family History  Problem Relation Age of Onset  . Heart attack Father   . Heart disease Sister      Prior to Admission medications   Medication Sig Start Date End Date Taking? Authorizing Provider  Ascorbic Acid (VITA-C PO) Take 1 tablet by mouth daily.   Yes [provider]  Cyanocobalamin (VITAMIN B-12 PO) Take 1 tablet by mouth daily.   Yes [provider]  Methylsulfonylmethane (MSM PO) Take 1 tablet by mouth daily.   Yes [provider]    Physical Exam: Vitals:   11/12/18 0115 11/12/18 0118  BP: (!) 103/59 (!) 103/59  Pulse: 93 82  Resp: 20 (!) 22  Temp:  98 F (36.7 C)  TempSrc:  Oral  SpO2: 100% 100%  Weight:  68 kg  Height:  5\' 4"  (1.626 m)    Constitutional: NAD, calm  Eyes: PERTLA, lids and conjunctivae normal ENMT: Mucous membranes are moist. Posterior pharynx clear of any exudate or lesions.   Neck: normal, supple, no masses, no thyromegaly Respiratory: clear to auscultation bilaterally, no wheezing, no crackles. Normal respiratory effort. No accessory muscle use.  Cardiovascular: S1 & S2 heard, regular rate and rhythm. No extremity edema.   Abdomen: No distension, no tenderness, soft. Bowel sounds active.  Musculoskeletal: no clubbing / cyanosis. No joint deformity upper and lower extremities.    Skin: no significant rashes, lesions,  ulcers. Warm, dry, well-perfused. Neurologic: No facial asymmetry. Sensation intact. Moving all extremities.  Psychiatric: Alert and oriented to person, place, and situation. Calm, cooperative.    Labs on Admission: I have personally reviewed following labs and imaging studies  CBC: Recent Labs  Lab 11/12/18 0115  WBC 8.1  NEUTROABS 4.1  HGB 12.2  HCT 37.9  MCV 86.9  PLT 340   Basic Metabolic Panel: Recent Labs  Lab 11/12/18 0115  NA 137  K 2.9*  CL 105  CO2 20*  GLUCOSE 123*  BUN 8  CREATININE 0.79  CALCIUM 9.4    GFR: Estimated Creatinine Clearance: 79.7 mL/min (by C-G formula based on SCr of 0.79 mg/dL). Liver Function Tests: Recent Labs  Lab 11/12/18 0115  AST 40  ALT 28  ALKPHOS 65  BILITOT 0.4  PROT 7.2  ALBUMIN 3.8   Recent Labs  Lab 11/12/18 0115  LIPASE 34   No results for input(s): AMMONIA in the last 168 hours. Coagulation Profile: No results for input(s): INR, PROTIME in the last 168 hours. Cardiac Enzymes: Recent Labs  Lab 11/12/18 0115  TROPONINI <0.03   BNP (last 3 results) No results for input(s): PROBNP in the last 8760 hours. HbA1C: No results for input(s): HGBA1C in the last 72 hours. CBG: No results for input(s): GLUCAP in the last 168 hours. Lipid Profile: No results for input(s): CHOL, HDL, LDLCALC, TRIG, CHOLHDL, LDLDIRECT in the last 72 hours. Thyroid Function Tests: No results for input(s): TSH, T4TOTAL, FREET4, T3FREE, THYROIDAB in the last 72 hours. Anemia Panel: No results for input(s): VITAMINB12, FOLATE, FERRITIN, TIBC, IRON, RETICCTPCT in the last 72 hours. Urine analysis:    Component Value Date/Time   COLORURINE RED (A) 09/11/2016 2202   APPEARANCEUR CLOUDY (A) 09/11/2016 2202   LABSPEC 1.010 09/11/2016 2202   PHURINE 8.0 09/11/2016 2202   GLUCOSEU NEGATIVE 09/11/2016 2202   HGBUR LARGE (A) 09/11/2016 2202   BILIRUBINUR NEGATIVE 09/11/2016 2202   KETONESUR NEGATIVE 09/11/2016 2202   PROTEINUR 30 (A) 09/11/2016 2202   NITRITE NEGATIVE 09/11/2016 2202   LEUKOCYTESUR TRACE (A) 09/11/2016 2202   Sepsis Labs: @LABRCNTIP (procalcitonin:4,lacticidven:4) )No results found for this or any previous visit (from the past 240 hour(s)).   Radiological Exams on Admission: Dg Chest 2 View  Result Date: 11/12/2018 CLINICAL DATA:  Chest pain EXAM: CHEST - 2 VIEW COMPARISON:  None. FINDINGS: The heart size and mediastinal contours are within normal limits. Both lungs are clear. The visualized skeletal structures are unremarkable. IMPRESSION: No active  cardiopulmonary disease. Electronically Signed   By: Jasmine Pang M.D.   On: 11/12/2018 01:51    EKG: Independently reviewed. Sinus rhythm, ST-depression in V4.   Assessment/Plan   1. Chest pain  - Presents with severe central chest pain that started at rest, waxing and waning in severity, initially improved when rubbing her chest  - She does not have a known hx of CAD, HTN, HLD, smoking, or DM; father had MI in his 49's  - Troponin is <0.03 in ED, CXR unremarkable, and EKG with ST-depression in V4  - She was given 324 mg ASA in ED  - NTG not given d/t low BP  - Cardiology is consulting and much appreciated  - Continue cardiac monitoring, obtain serial troponin measurements, repeat EKG    2. Hypokalemia  - Serum potassium is 2.9 in ED  - Treated in ED with 40 mEq oral and 10 mEq IV potassium, as well as 2 g IV magnesium  -  Continue cardiac monitoring, repeat chem panel in am    3. Hypotension  - SBP low 100's in ED initially, down to 80's prior to any NTG or opiate per RN  - She is not febrile and there is no infectious signs/sxs; extremities warm and well-perfused; CXR unremarkable and cardiac enzymes normal; there is no bleeding and Hgb is wnl; no hx to suggest adrenal crisis  - Bolus 1 liter NS now, culture if febrile, continue cardiac monitoring     DVT prophylaxis: Lovenox  Code Status: Full  Family Communication: Sister-in-law updated at bedside  Consults called: Cardiology  Admission status: Observation     Briscoe Deutscherimothy S , MD [facility] Pager [phone]  If 7PM-7AM, please contact night-coverage [email or web address] Password [email or web address]  11/12/2018, 3:28 AM

## 2018-11-12 NOTE — ED Notes (Signed)
ED TO INPATIENT HANDOFF REPORT  ED Nurse Name and Phone #: Gladstone Lighter [phone]  S Name/Age/Gender Cathy Rice 48 y.o. female Room/Bed: 016C/016C  Code Status   Code Status: Full Code  Home/SNF/Other Home Patient oriented to: self, place, time and situation Is this baseline? Yes   Triage Complete: Triage complete  Chief Complaint Chest pain  Triage Note Pt arrived via EMS from home with c/o severe intermittent mid to left sided CP onset @ 0000 tonight while at rest.  No n/v, pt noted to be clutching chest on arrival, yelling out with increased respirations.  IV est, pt took ASA 325mg  x 3 prior to EMS arrival.  Pt's primary language is portuguese, family at bedside.     Allergies Allergies  Allergen Reactions  . Lactose Intolerance (Gi) Diarrhea  . Wheat Bran Diarrhea    Level of Care/Admitting Diagnosis ED Disposition    ED Disposition Condition Comment   Admit  Hospital Area: [facility] [100100]  Level of Care: Progressive [102]  I expect the patient will be discharged within 24 hours: Yes  LOW acuity---Tx typically complete <24 hrs---ACUTE conditions typically can be evaluated <24 hours---LABS likely to return to acceptable levels <24 hours---IS near functional baseline---EXPECTED to return to current living arrangement---NOT newly hypoxic: Meets criteria for 5C-Observation unit  Diagnosis: Chest pain at rest [735329]  Admitting Physician: Briscoe Deutscher [[id number]]  Attending Physician: Briscoe Deutscher [[id number]]  PT Class (Do Not Modify): Observation [104]  PT Acc Code (Do Not Modify): Observation [10022]       B Medical/Surgery History Past Medical History:  Diagnosis Date  . Asthma    History reviewed. No pertinent surgical history.   A IV Location/Drains/Wounds Patient Lines/Drains/Airways Status   Active Line/Drains/Airways    Name:   Placement date:   Placement time:   Site:   Days:   Peripheral IV 11/12/18 Left  Antecubital   11/12/18    0114    Antecubital   less than 1          Intake/Output Last 24 hours No intake or output data in the 24 hours ending 11/12/18 0404  Labs/Imaging Results for orders placed or performed during the hospital encounter of 11/12/18 (from the past 48 hour(s))  CBC with Differential     Status: None   Collection Time: 11/12/18  1:15 AM  Result Value Ref Range   WBC 8.1 4.0 - 10.5 K/uL   RBC 4.36 3.87 - 5.11 MIL/uL   Hemoglobin 12.2 12.0 - 15.0 g/dL   HCT 97.9 89.2 - 11.9 %   MCV 86.9 80.0 - 100.0 fL   MCH 28.0 26.0 - 34.0 pg   MCHC 32.2 30.0 - 36.0 g/dL   RDW 41.7 40.8 - 14.4 %   Platelets 340 150 - 400 K/uL   nRBC 0.0 0.0 - 0.2 %   Neutrophils Relative % 50 %   Neutro Abs 4.1 1.7 - 7.7 K/uL   Lymphocytes Relative 40 %   Lymphs Abs 3.2 0.7 - 4.0 K/uL   Monocytes Relative 7 %   Monocytes Absolute 0.6 0.1 - 1.0 K/uL   Eosinophils Relative 2 %   Eosinophils Absolute 0.1 0.0 - 0.5 K/uL   Basophils Relative 1 %   Basophils Absolute 0.0 0.0 - 0.1 K/uL   Immature Granulocytes 0 %   Abs Immature Granulocytes 0.02 0.00 - 0.07 K/uL    Comment: Performed at [facility], [address],  [place], [place] [address]  Comprehensive metabolic panel     Status: Abnormal   Collection Time: 11/12/18  1:15 AM  Result Value Ref Range   Sodium 137 135 - 145 mmol/L   Potassium 2.9 (L) 3.5 - 5.1 mmol/L   Chloride 105 98 - 111 mmol/L   CO2 20 (L) 22 - 32 mmol/L   Glucose, Bld 123 (H) 70 - 99 mg/dL   BUN 8 6 - 20 mg/dL   Creatinine, Ser 2.59 0.44 - 1.00 mg/dL   Calcium 9.4 8.9 - 56.3 mg/dL   Total Protein 7.2 6.5 - 8.1 g/dL   Albumin 3.8 3.5 - 5.0 g/dL   AST 40 15 - 41 U/L   ALT 28 0 - 44 U/L   Alkaline Phosphatase 65 38 - 126 U/L   Total Bilirubin 0.4 0.3 - 1.2 mg/dL   GFR calc non Af Amer >60 >60 mL/min   GFR calc Af Amer >60 >60 mL/min   Anion gap 12 5 - 15    Comment: Performed at [facility], [address], [place], [place] [address]  Troponin I  - ONCE - STAT     Status: None   Collection Time: 11/12/18  1:15 AM  Result Value Ref Range   Troponin I <0.03 <0.03 ng/mL    Comment: Performed at [facility], [address], [place], [place] [address]  Lipase, blood     Status: None   Collection Time: 11/12/18  1:15 AM  Result Value Ref Range   Lipase 34 11 - 51 U/L    Comment: Performed at [facility], [address], [place], [place] [address]  I-Stat beta hCG blood, ED     Status: None   Collection Time: 11/12/18  1:47 AM  Result Value Ref Range   I-stat hCG, quantitative <5.0 <5 mIU/mL   Comment 3            Comment:   GEST. AGE      CONC.  (mIU/mL)   <=1 WEEK        5 - 50     2 WEEKS       50 - 500     3 WEEKS       100 - 10,000     4 WEEKS     1,000 - 30,000        FEMALE AND NON-PREGNANT FEMALE:     LESS THAN 5 mIU/mL   I-stat troponin, ED     Status: None   Collection Time: 11/12/18  1:48 AM  Result Value Ref Range   Troponin i, poc 0.00 0.00 - 0.08 ng/mL   Comment 3            Comment: Due to the release kinetics of cTnI, a negative result within the first hours of the onset of symptoms does not rule out myocardial infarction with certainty. If myocardial infarction is still suspected, repeat the test at appropriate intervals.    Dg Chest 2 View  Result Date: 11/12/2018 CLINICAL DATA:  Chest pain EXAM: CHEST - 2 VIEW COMPARISON:  None. FINDINGS: The heart size and mediastinal contours are within normal limits. Both lungs are clear. The visualized skeletal structures are unremarkable. IMPRESSION: No active cardiopulmonary disease. Electronically Signed   By: Jasmine Pang M.D.   On: 11/12/2018 01:51    Pending Labs Unresulted Labs (From admission, onward)    Start     Ordered   11/19/18  0500  Creatinine, serum  (enoxaparin (LOVENOX)    CrCl >/= 30 ml/min)  Weekly,   R    Comments:  while on enoxaparin therapy    11/12/18 0328   11/13/18 0500  Magnesium  Tomorrow morning,   STAT     11/12/18  0328   11/12/18 0500  HIV antibody (Routine Testing)  Tomorrow morning,   R     11/12/18 0328   11/12/18 0500  Troponin I - Now Then Q6H  Now then every 6 hours,   STAT     11/12/18 0328          Vitals/Pain Today's Vitals   11/12/18 0115 11/12/18 0118 11/12/18 0300  BP: (!) 103/59 (!) 103/59 (!) 89/29  Pulse: 93 82 71  Resp: 20 (!) 22 18  Temp:  98 F (36.7 C)   TempSrc:  Oral   SpO2: 100% 100% 98%  Weight:  68 kg   Height:   (1.626 m)     Isolation Precautions No active isolations  Medications Medications  sodium chloride flush (NS) 0.9 % injection 3 mL (has no administration in time range)  aspirin chewable tablet 324 mg (has no administration in time range)  fentaNYL (SUBLIMAZE) injection 50 mcg (has no administration in time range)  potassium chloride SA (K-DUR,KLOR-CON) CR tablet 40 mEq (has no administration in time range)  magnesium sulfate IVPB 2 g 50 mL (2 g Intravenous New Bag/Given 11/12/18 0328)  potassium chloride 10 mEq in 100 mL IVPB (10 mEq Intravenous New Bag/Given 11/12/18 0326)  aspirin EC tablet 81 mg (has no administration in time range)  nitroGLYCERIN (NITROSTAT) SL tablet 0.4 mg (has no administration in time range)  acetaminophen (TYLENOL) tablet 650 mg (has no administration in time range)  ondansetron (ZOFRAN) injection 4 mg (has no administration in time range)  enoxaparin (LOVENOX) injection 40 mg (has no administration in time range)  morphine 2 MG/ML injection 1-3 mg (has no administration in time range)  sodium chloride 0.9 % bolus 1,000 mL (has no administration in time range)    Mobility walks     Focused Assessments Cardiac Assessment Handoff:    Lab Results  Component Value Date   TROPONINI <0.03 11/12/2018   No results found for: DDIMER Does the Patient currently have chest pain? Yes     R Recommendations: See Admitting Provider Note  Report given to:   Additional Notes: pt now rates pain 2/10, positional and  relieved with palpation. 1L bolus being given for BP.  Primary language [place]

## 2018-11-12 NOTE — ED Notes (Addendum)
Pain appears to occur in waves, pt requesting family press/rub sternum, this appears to ease pain.  Pt denies n/v/d, denies fever, denies recent travel.

## 2018-11-12 NOTE — ED Triage Notes (Signed)
Pt arrived via EMS from home with c/o severe intermittent mid to left sided CP onset @ 0000 tonight while at rest.  No n/v, pt noted to be clutching chest on arrival, yelling out with increased respirations.  IV est, pt took ASA 325mg  x 3 prior to EMS arrival.  Pt's primary language is portuguese, family at bedside.

## 2018-11-12 NOTE — ED Notes (Signed)
This RN in room responding to pump alarm signifying completion of Mg infusion. KCL alarm noted to be sounding as well. Mg channel turned off, KCL continued to show occlusion even after restarting channel, pt visitor then unclamps IV, pump began working at that time, pt c/o burning at site at this time, NS restarted with KCL.  When questioned visitor at bedside who clamped IV line, she stated she did when then alarm started. This RN ask visitor not to make adjustments to medical equipment, especially IV lines as it could compromise patency.  No swelling or redness noted at insertion site. Pt no long c/o pain/burning.

## 2018-11-14 ENCOUNTER — Inpatient Hospital Stay: Admission: RE | Admit: 2018-11-14 | Payer: Self-pay | Source: Ambulatory Visit

## 2018-11-14 ENCOUNTER — Telehealth: Payer: Self-pay | Admitting: *Deleted

## 2018-11-14 DIAGNOSIS — I2699 Other pulmonary embolism without acute cor pulmonale: Secondary | ICD-10-CM

## 2018-11-14 NOTE — Telephone Encounter (Signed)
-----   Message from Rollene Rotunda, MD sent at 11/14/2018 10:39 AM EDT ----- Can you call this patient.  She needs to have a CT to rule out PE.  Should be today.  You will need an interpreter.  She is from [place].

## 2018-11-14 NOTE — Telephone Encounter (Signed)
CT ordered and send to scheduler to be schedule

## 2018-11-15 ENCOUNTER — Other Ambulatory Visit: Payer: Self-pay

## 2018-11-15 ENCOUNTER — Ambulatory Visit (INDEPENDENT_AMBULATORY_CARE_PROVIDER_SITE_OTHER)
Admission: RE | Admit: 2018-11-15 | Discharge: 2018-11-15 | Disposition: A | Discharge status: Home / Self Care | Payer: Self-pay | Source: Ambulatory Visit | Attending: Cardiology | Admitting: Cardiology

## 2018-11-15 DIAGNOSIS — I2699 Other pulmonary embolism without acute cor pulmonale: Secondary | ICD-10-CM

## 2018-11-15 MED ORDER — IOHEXOL 350 MG/ML SOLN
75.0000 mL | Freq: Once | INTRAVENOUS | Status: AC | PRN
  Administered 2018-11-15: 75 mL via INTRAVENOUS

## 2019-04-16 ENCOUNTER — Telehealth: Payer: Self-pay | Admitting: Cardiology

## 2019-04-16 NOTE — Telephone Encounter (Signed)
LVM for patient to call and schedule a 2 month followup with Dr. Percival Spanish.

## 2019-06-10 ENCOUNTER — Other Ambulatory Visit: Payer: Self-pay

## 2019-06-10 ENCOUNTER — Emergency Department (HOSPITAL_COMMUNITY)
Admission: EM | Admit: 2019-06-10 | Discharge: 2019-06-10 | Disposition: A | Discharge status: Home / Self Care | Payer: Self-pay | Attending: Emergency Medicine | Admitting: Emergency Medicine

## 2019-06-10 ENCOUNTER — Emergency Department (HOSPITAL_COMMUNITY): Payer: Self-pay

## 2019-06-10 DIAGNOSIS — Y999 Unspecified external cause status: Secondary | ICD-10-CM | POA: Insufficient documentation

## 2019-06-10 DIAGNOSIS — W109XXA Fall (on) (from) unspecified stairs and steps, initial encounter: Secondary | ICD-10-CM | POA: Insufficient documentation

## 2019-06-10 DIAGNOSIS — J45909 Unspecified asthma, uncomplicated: Secondary | ICD-10-CM | POA: Insufficient documentation

## 2019-06-10 DIAGNOSIS — Y9301 Activity, walking, marching and hiking: Secondary | ICD-10-CM | POA: Insufficient documentation

## 2019-06-10 DIAGNOSIS — Z79899 Other long term (current) drug therapy: Secondary | ICD-10-CM | POA: Insufficient documentation

## 2019-06-10 DIAGNOSIS — Y929 Unspecified place or not applicable: Secondary | ICD-10-CM | POA: Insufficient documentation

## 2019-06-10 DIAGNOSIS — M25572 Pain in left ankle and joints of left foot: Secondary | ICD-10-CM | POA: Insufficient documentation

## 2019-06-10 MED ORDER — HYDROCODONE-ACETAMINOPHEN 5-325 MG PO TABS
1.0000 | ORAL_TABLET | Freq: Once | ORAL | Status: AC
  Administered 2019-06-10: 13:00:00 1 via ORAL
  Filled 2019-06-10: qty 1

## 2019-06-10 MED ORDER — NAPROXEN 500 MG PO TABS: 500.0000 mg | ORAL_TABLET | Freq: Two times a day (BID) | ORAL | 0 refills | Status: AC

## 2019-06-10 MED ORDER — HYDROCODONE-ACETAMINOPHEN 5-325 MG PO TABS: 1.0000 | ORAL_TABLET | ORAL | 0 refills | Status: AC | PRN

## 2019-06-10 NOTE — ED Triage Notes (Signed)
Patient tripped and fell down steps at home about 20 minutes ago. She denies LOC. She is complaining of left ankle pain and left leg pain.

## 2019-06-10 NOTE — Progress Notes (Signed)
Orthopedic Tech Progress Note Patient Details:  Cathy Rice [birth year] [id number]  Ortho Devices Type of Ortho Device: Ankle Air splint, Crutches Ortho Device/Splint Location: LLE Ortho Device/Splint Interventions: Adjustment, Application, Ordered   Post Interventions Patient Tolerated: Ambulated well, Well Instructions Provided: Poper ambulation with device, Care of device, Adjustment of device   Janit Pagan 06/10/2019, 2:19 PM

## 2019-06-10 NOTE — Discharge Instructions (Signed)
Take the pain medicine as prescribed.  Do not drive or operate heavy machinery when taking the medicine please ice, elevate your ankle.  Please follow-up with orthopedics in 3 to 4 days if you continue to have symptoms.

## 2019-06-10 NOTE — ED Provider Notes (Signed)
[facility] EMERGENCY DEPARTMENT Provider Note   CSN: [id number] Arrival date & time: 06/10/19  1106    History   Chief Complaint Chief Complaint  Patient presents with   Ankle Pain    Left   HPI Patsie Mccardle de Ezequiel Ganser is a 48 y.o. female with past medical history significant for hypertension, hypokalemia who presents for evaluation after mechanical fall.  Patient was walking down steps into the garage approximately 3 steps up when she fell on her left hand side.  Patient denies hitting her head, LOC or anticoagulation.  Patient was unable to walk secondary to left ankle pain since the incident.  Denies headache, vision changes, unilateral weakness, midline back pain, pelvic pain, numbness or tingling, redness, swelling, warmth to extremities.  Denies additional rating or alleviating factors.  She is not followed by orthopedics.  Her current pain a 10/10 located to her left lateral malleolus.  Patient was not able to ambulate after fall secondary to her ankle pain.  History obtained from patient past medical records.  Patient does speak [place] as her first language.  I offered medical Spanish interpreter however patient declined.  She does have her sister-in-law in the room who she would like to provide interpretation services with.     HPI  Past Medical History:  Diagnosis Date   Asthma     Patient Active Problem List   Diagnosis Date Noted   Chest pain at rest 11/12/2018   Hypokalemia 11/12/2018   Hypotension 11/12/2018    No past surgical history on file.   OB History   No obstetric history on file.      Home Medications    Prior to Admission medications   Medication Sig Start Date End Date Taking? Authorizing Provider  Ascorbic Acid (VITA-C PO) Take 1 tablet by mouth daily.    [provider]  Cyanocobalamin (VITAMIN B-12 PO) Take 1 tablet by mouth daily.    [provider]  HYDROcodone-acetaminophen (NORCO/VICODIN)  5-325 MG tablet Take 1 tablet by mouth every 4 (four) hours as needed. 06/10/19   Sabrine Patchen A, PA-C  Methylsulfonylmethane (MSM PO) Take 1 tablet by mouth daily.    [provider]  naproxen (NAPROSYN) 500 MG tablet Take 1 tablet (500 mg total) by mouth 2 (two) times daily. 06/10/19   Litzy Dicker A, PA-C    Family History Family History  Problem Relation Age of Onset   Heart attack Father    Heart disease Sister    Social History Social History   Tobacco Use   Smoking status: Never Smoker   Smokeless tobacco: Never Used  Substance Use Topics   Alcohol use: No   Drug use: Never    Allergies   Lactose intolerance (gi) and Wheat bran   Review of Systems Review of Systems  Constitutional: Negative.   HENT: Negative.   Respiratory: Negative.   Cardiovascular: Negative.   Gastrointestinal: Negative.   Genitourinary: Negative.   Musculoskeletal: Positive for gait problem. Negative for back pain, neck pain and neck stiffness.       Left ankle, left hip pain.  Skin: Negative.   All other systems reviewed and are negative.    Physical Exam Updated Vital Signs BP (!) 97/57    Pulse 69    Temp 97.9 F (36.6 C) (Oral)    Resp 14    Ht 5' 1.02" (1.55 m)    Wt 63.5 kg    SpO2 100%    BMI  26.43 kg/m   Physical Exam Vitals signs and nursing note reviewed.  Constitutional:      General: She is not in acute distress.    Appearance: She is well-developed. She is not ill-appearing, toxic-appearing or diaphoretic.  HENT:     Head: Normocephalic and atraumatic.     Nose: Nose normal.     Mouth/Throat:     Mouth: Mucous membranes are moist.     Pharynx: Oropharynx is clear.  Eyes:     Pupils: Pupils are equal, round, and reactive to light.  Neck:     Musculoskeletal: Normal range of motion.  Cardiovascular:     Rate and Rhythm: Normal rate.     Pulses: Normal pulses.     Heart sounds: Normal heart sounds.  Pulmonary:     Effort: Pulmonary effort  is normal. No respiratory distress.     Breath sounds: Normal breath sounds.  Abdominal:     General: Bowel sounds are normal. There is no distension.     Comments: Soft, nontender without rebound or guarding.  No flank tenderness or ecchymosis.  Musculoskeletal:     Right hip: Normal.     Left hip: Normal.     Right knee: Normal. She exhibits normal range of motion.     Left knee: Normal.     Right ankle: Normal.     Left ankle: She exhibits decreased range of motion and swelling. She exhibits no ecchymosis, no deformity, no laceration and normal pulse. Tenderness. Lateral malleolus and AITFL tenderness found. No medial malleolus, no CF ligament, no posterior TFL and no head of 5th metatarsal tenderness found. Achilles tendon exhibits no pain, no defect and normal Thompson's test results.     Cervical back: Normal.     Thoracic back: Normal.     Lumbar back: Normal.     Right lower leg: Normal.     Left lower leg: Normal.     Right foot: Normal.     Left foot: Decreased range of motion. Normal capillary refill. Tenderness and swelling present. No bony tenderness, crepitus, deformity or laceration.       Feet:     Comments: Elvis stable to palpation without tenderness.  She does have minimal tenderness to her left aspect over her femoral head.  She does not any shortening or rotation of legs.  Flexes and extends at bilateral lower extremities.  She does have decreased plantarflexion, dorsiflexion to her left ankle secondary to pain.  Patient with soft tissue swelling to her left lateral malleolus.  She does have some mild tenderness over ATFL lateral aspect of her foot.  Negative varus, valgus stress, anterior drawer to bilateral knees.  She has no midline spinal tenderness.  Compartments soft.  Skin:    General: Skin is warm and dry.  Neurological:     Mental Status: She is alert.      ED Treatments / Results  Labs (all labs ordered are listed, but only abnormal results are  displayed) Labs Reviewed - No data to display  EKG None  Radiology Dg Ankle Complete Left  Result Date: 06/10/2019 CLINICAL DATA:  Left ankle pain after fall today. EXAM: LEFT ANKLE COMPLETE - 3+ VIEW COMPARISON:  None. FINDINGS: There is no evidence of fracture, dislocation, or joint effusion. There is no evidence of arthropathy or other focal bone abnormality. Soft tissues are unremarkable. IMPRESSION: Negative. Electronically Signed   By: Marijo Conception M.D.   On: 06/10/2019 13:13   Dg Foot  Complete Left  Result Date: 06/10/2019 CLINICAL DATA:  Left foot pain after injury today. EXAM: LEFT FOOT - COMPLETE 3+ VIEW COMPARISON:  None. FINDINGS: There is no evidence of fracture or dislocation. There is no evidence of arthropathy or other focal bone abnormality. Soft tissues are unremarkable. IMPRESSION: Negative. Electronically Signed   By: Lupita Raider M.D.   On: 06/10/2019 13:14   Dg Hip Unilat W Or Wo Pelvis 2-3 Views Left  Result Date: 06/10/2019 CLINICAL DATA:  Patient tripped and fell down steps at home about 20 minutes ago. She denies LOC. She is complaining of left ankle pain and left leg pain. EXAM: DG HIP (WITH OR WITHOUT PELVIS) 2-3V LEFT COMPARISON:  None. FINDINGS: There is no evidence of hip fracture or dislocation. There is no evidence of arthropathy or other focal bone abnormality. No pelvic diastasis. SI joints are open. Nonobstructive bowel gas pattern. Regional soft tissues are unremarkable. IMPRESSION: Negative left hip radiographs. Electronically Signed   By: Emmaline Kluver M.D.   On: 06/10/2019 13:13    Procedures .Ortho Injury Treatment  Date/Time: 06/10/2019 1:43 PM Performed by: Linwood Dibbles, PA-C Authorized by: Linwood Dibbles, PA-C   Consent:    Consent obtained:  Verbal   Consent given by:  Patient   Risks discussed:  Fracture, nerve damage, restricted joint movement, vascular damage, stiffness, recurrent dislocation and irreducible  dislocation   Alternatives discussed:  No treatment, alternative treatment, immobilization, referral and delayed treatmentInjury location: ankle Location details: left ankle Injury type: Sprain. Pre-procedure neurovascular assessment: neurovascularly intact Pre-procedure distal perfusion: normal Pre-procedure neurological function: normal Pre-procedure range of motion: normal  Anesthesia: Local anesthesia used: no  Patient sedated: NoImmobilization: splint (Air cast) Splint type: short leg Post-procedure neurovascular assessment: post-procedure neurovascularly intact Post-procedure distal perfusion: normal Post-procedure neurological function: normal Post-procedure range of motion: normal Patient tolerance: patient tolerated the procedure well with no immediate complications    (including critical care time)  Medications Ordered in ED Medications  HYDROcodone-acetaminophen (NORCO/VICODIN) 5-325 MG per tablet 1 tablet (1 tablet Oral Given 06/10/19 1304)   Initial Impression / Assessment and Plan / ED Course  I have reviewed the triage vital signs and the nursing notes.  Pertinent labs & imaging results that were available during my care of the patient were reviewed by me and considered in my medical decision making (see chart for details).  48 year old female appears otherwise well presents for evaluation of ankle pain after mechanical fall.  He is afebrile, nonseptic, non-ill-appearing.  She denies hitting head, LOC or anticoagulation.  Do not feel patient needs head or cervical imaging at this time.  Patient with soft tissue swelling and tenderness to her left lateral malleolus and ATFL extending into her lateral foot.  She was unable to ambulate after the incident.  She does have some minimal tenderness to her left femoral head.  Her pelvis is stable, nontender palpation.  She has a nontender spinal column without stepoffs.  She is neurovascularly intact.  Her compartments are soft.   We will obtain imaging, provide pain management.  Reevaluation pain controlled. Plain film negative for acute fracture, dislocation, effusion.  Will place in Aircast to left ankle and provide crutches.  RICE for symptomatic management.  Patient to follow-up with orthopedics for reevaluation.  She continues to be neurovascularly intact.  Compartment soft.  The patient has been appropriately medically screened and/or stabilized in the ED. I have low suspicion for any other emergent medical condition which would require further  screening, evaluation or treatment in the ED or require inpatient management.  Patient is hemodynamically stable and in no acute distress.   Evaluation does not show acute pathology that would require ongoing or additional emergent interventions while in the emergency department or further inpatient treatment.  I have discussed the diagnosis with the patient and answered all questions.  Pain is been managed while in the emergency department and patient has no further complaints prior to discharge.  Patient is comfortable with plan discussed in room and is stable for discharge at this time.  I have discussed strict return precautions for returning to the emergency department.  Patient was encouraged to follow-up with PCP/specialist refer to at discharge.      Final Clinical Impressions(s) / ED Diagnoses   Final diagnoses:  Acute left ankle pain    ED Discharge Orders         Ordered    HYDROcodone-acetaminophen (NORCO/VICODIN) 5-325 MG tablet  Every 4 hours PRN     06/10/19 1338    naproxen (NAPROSYN) 500 MG tablet  2 times daily     06/10/19 1338           Marlyss Cissell A, PA-C 06/10/19 1424    Melene PlanFloyd, Dan, DO 06/10/19 1516

## 2019-06-20 NOTE — Progress Notes (Signed)
Error

## 2019-06-21 ENCOUNTER — Other Ambulatory Visit: Payer: Self-pay

## 2019-06-21 ENCOUNTER — Encounter: Payer: Self-pay | Admitting: Cardiology

## 2019-06-21 ENCOUNTER — Ambulatory Visit (INDEPENDENT_AMBULATORY_CARE_PROVIDER_SITE_OTHER): Payer: Self-pay | Admitting: Cardiology

## 2019-06-21 VITALS — BP 97/49 | HR 80 | Temp 97.5°F | Ht 61.0 in | Wt 139.0 lb

## 2019-06-21 DIAGNOSIS — R079 Chest pain, unspecified: Secondary | ICD-10-CM

## 2019-07-05 ENCOUNTER — Telehealth: Payer: Self-pay | Admitting: *Deleted

## 2019-07-05 NOTE — Telephone Encounter (Signed)
A message was left, re: her follow up visit. 

## 2019-08-22 ENCOUNTER — Encounter: Payer: Self-pay | Admitting: Cardiology

## 2019-11-22 IMAGING — CR DG ANKLE COMPLETE 3+V*L*
3 series · 3 of 3 positions shown · non-contrast
Comparison: None.

CLINICAL DATA: Left ankle pain after fall today.

EXAM:
LEFT ANKLE COMPLETE - 3+ VIEW

[ankle ap]
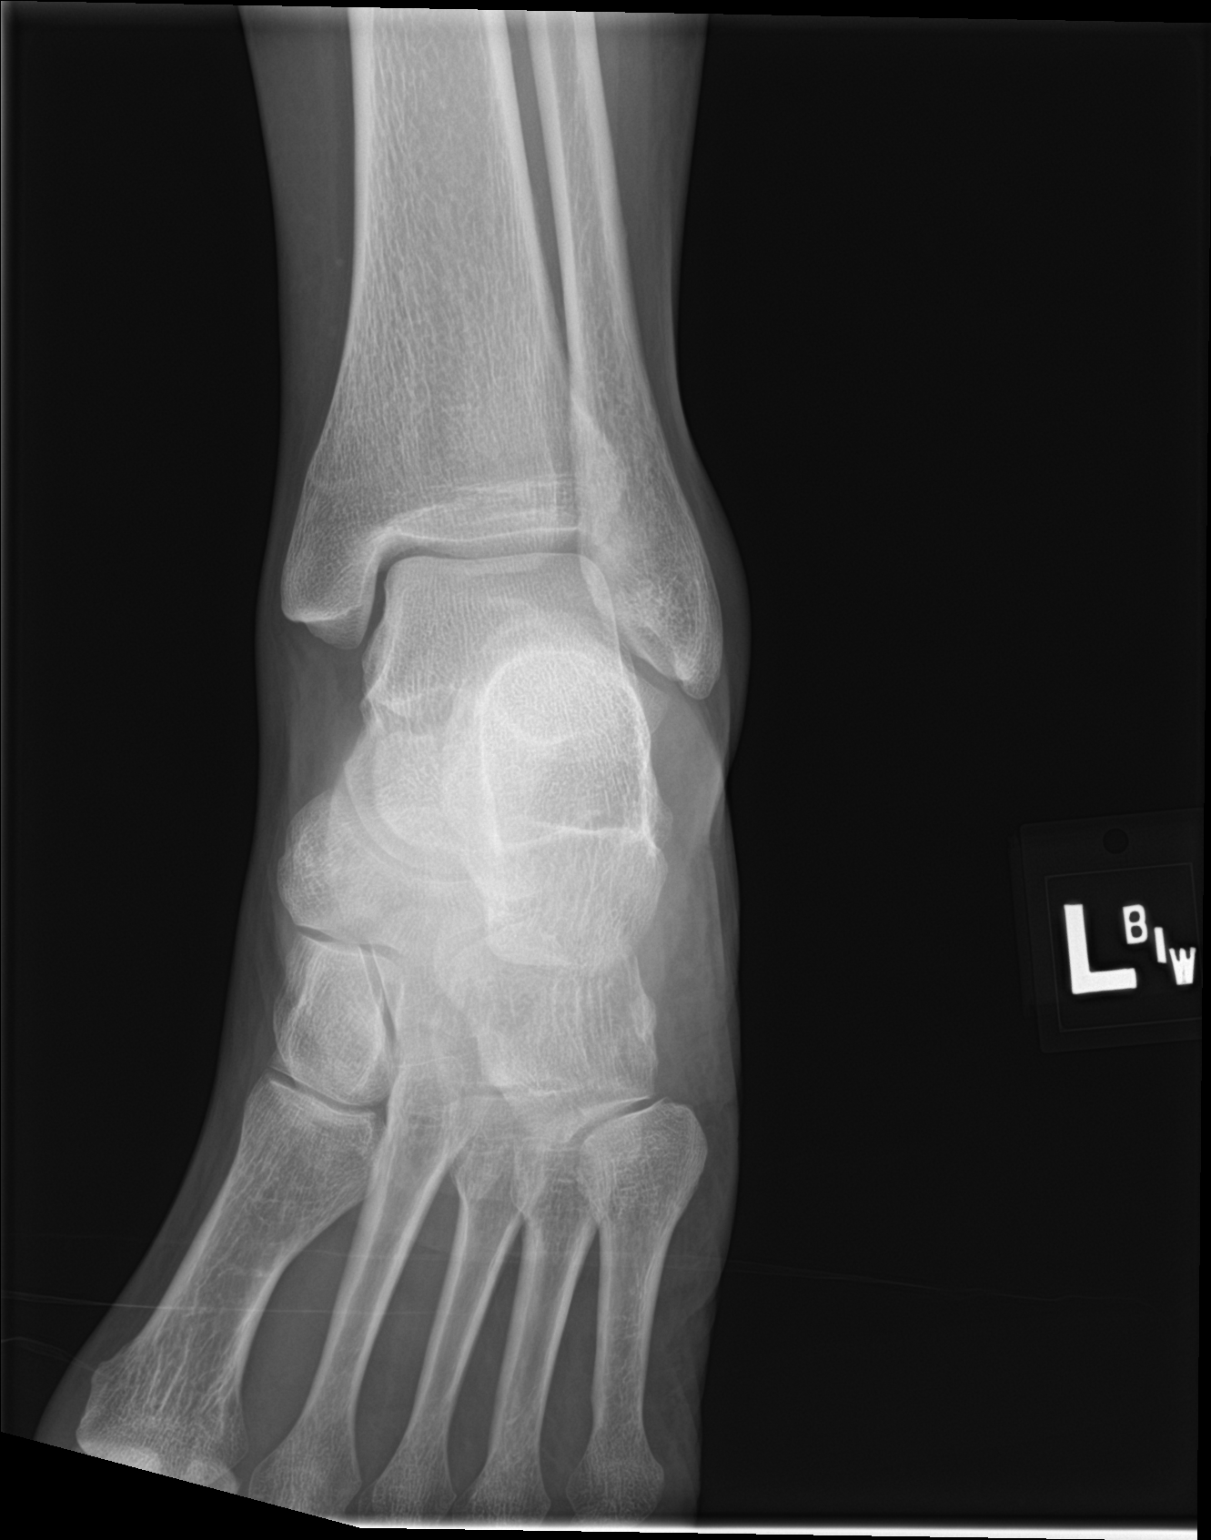

[ankle obl]
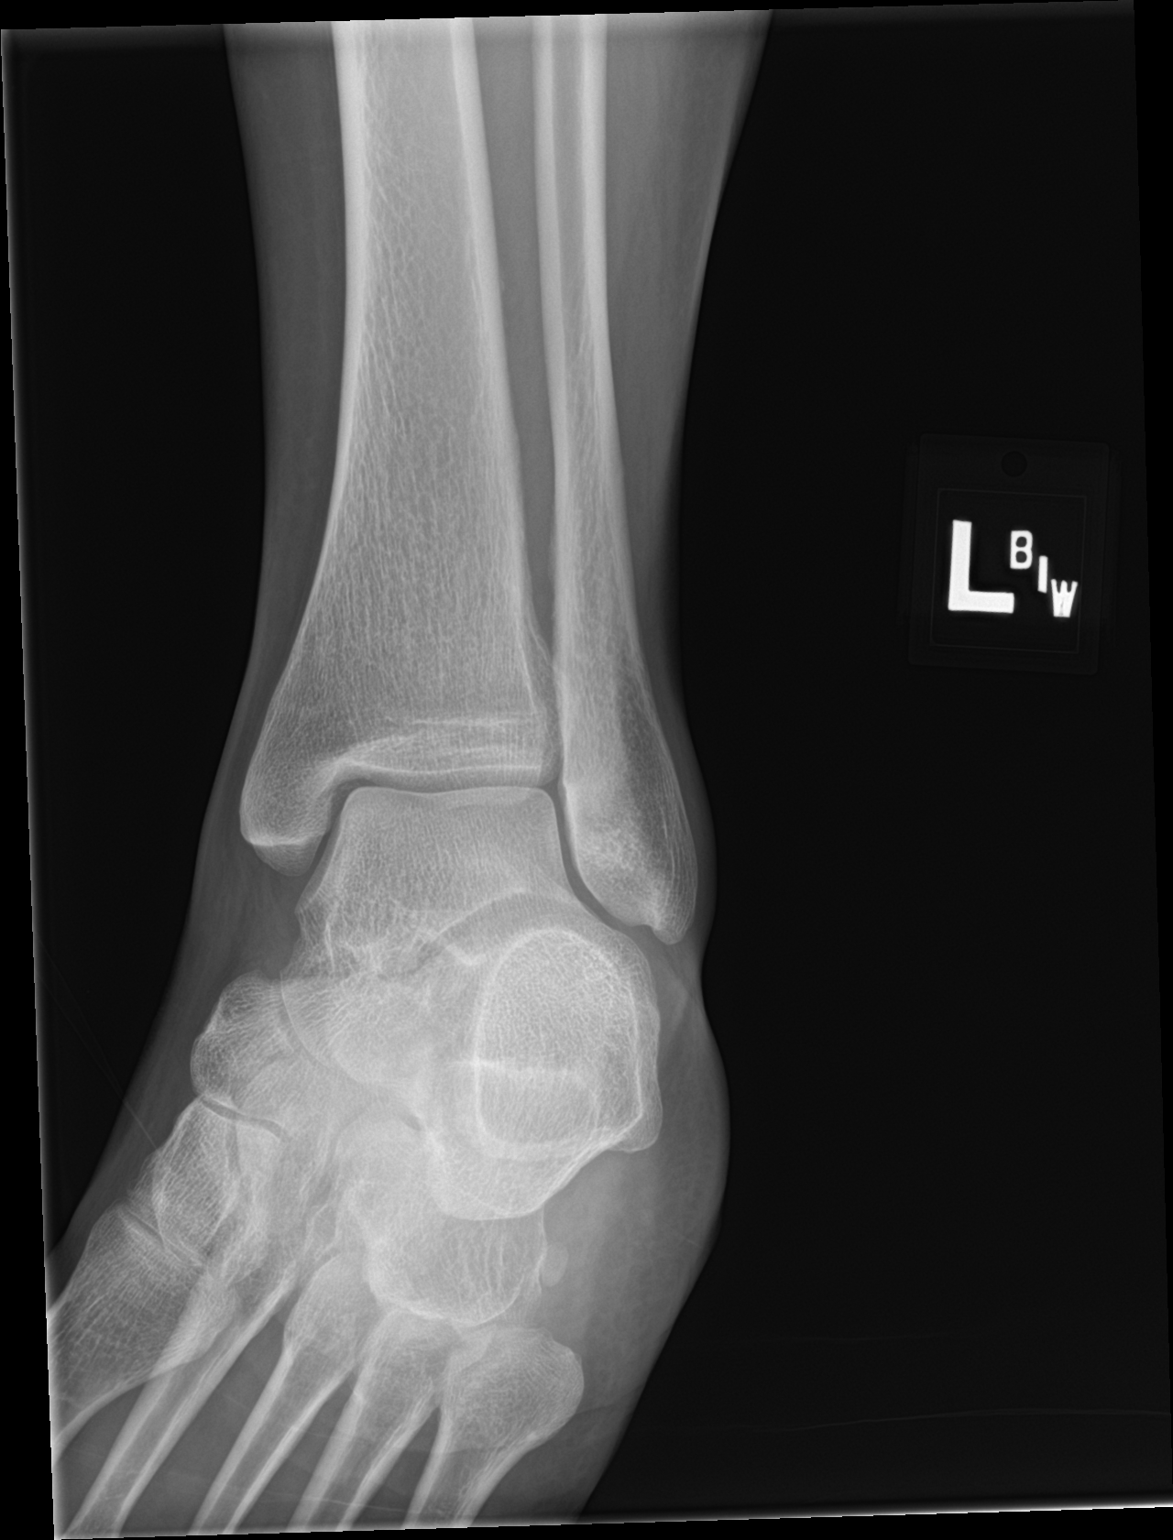

[ankle lat]
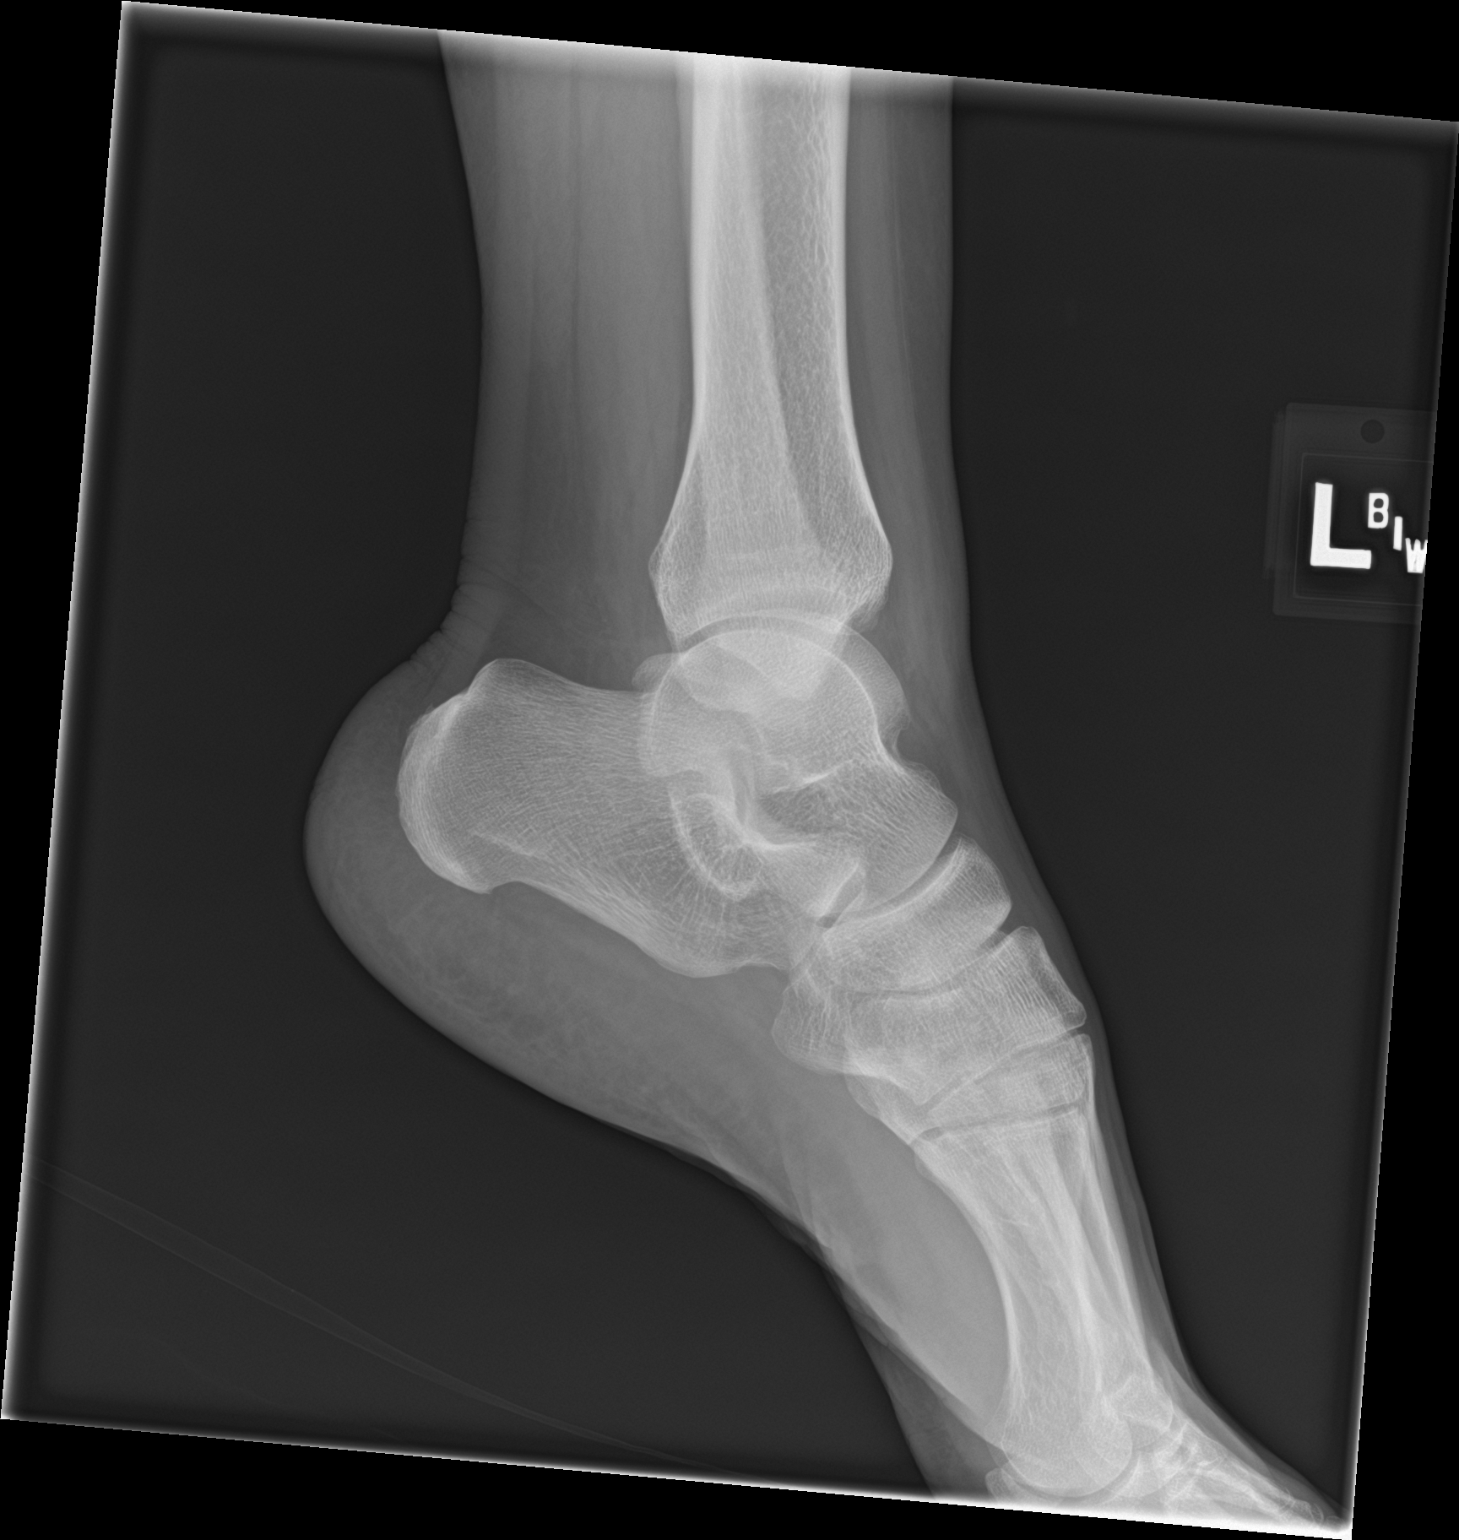

[3 of 3 positions shown; findings below may reference images not displayed]

FINDINGS: There is no evidence of fracture, dislocation, or joint effusion.
There is no evidence of arthropathy or other focal bone abnormality.
Soft tissues are unremarkable.
IMPRESSION: Negative.
